# Patient Record
Sex: Male | Born: 1975 | Race: White | Hispanic: No | Marital: Married | Smoking: Former smoker
Health system: Southern US, Community
[De-identification: ages and names within clinical notes are randomized; demographics above are authoritative.]

---

## 2018-06-03 ENCOUNTER — Encounter (HOSPITAL_COMMUNITY): Payer: Self-pay | Admitting: Emergency Medicine

## 2018-06-03 ENCOUNTER — Ambulatory Visit (HOSPITAL_COMMUNITY)
Admission: EM | Admit: 2018-06-03 | Discharge: 2018-06-03 | Disposition: A | Discharge status: Home / Self Care | Payer: PRIVATE HEALTH INSURANCE | Attending: Internal Medicine | Admitting: Internal Medicine

## 2018-06-03 DIAGNOSIS — L723 Sebaceous cyst: Secondary | ICD-10-CM

## 2018-06-03 DIAGNOSIS — S00432A Contusion of left ear, initial encounter: Secondary | ICD-10-CM | POA: Diagnosis not present

## 2018-06-03 MED ORDER — PREDNISONE 50 MG PO TABS: 50.0000 mg | ORAL_TABLET | Freq: Once | ORAL | 0 refills | Status: AC

## 2018-06-03 MED ORDER — SULFAMETHOXAZOLE-TRIMETHOPRIM 800-160 MG PO TABS: 1.0000 | ORAL_TABLET | Freq: Two times a day (BID) | ORAL | 0 refills | Status: AC

## 2018-06-03 NOTE — ED Triage Notes (Signed)
Pt states he was hit by a baseball on his L ear, states last night his ear started swelling.

## 2018-06-03 NOTE — ED Provider Notes (Signed)
[facility]    CSN: [id number] Arrival date & time: 06/03/18  1242     History   Chief Complaint Chief Complaint  Patient presents with  . Otalgia    HPI John Parsons is a 42 y.o. male.   He was struck by a soccer ball on the left ear a few days ago.  Last evening, there was a little bit of swelling in the ear, and this morning he had some discomfort and increased swelling.  Swelling is in the lobe of the ear, where there is a chronic cyst.  He had a similar injury to the left ear several years ago, with swelling in the lobe, and this was managed by lancing the cyst.  No dizziness, no nausea, no difficulty thinking or concentrating.  No vision change.  Was able to walk into the urgent care independently.  Describes a slight headache last night, now resolved.    HPI  History reviewed. No pertinent past medical history.   History reviewed. No pertinent surgical history.     Home Medications    Prior to Admission medications   Medication Sig Start Date End Date Taking? Authorizing Provider  predniSONE (DELTASONE) 50 MG tablet Take 1 tablet (50 mg total) by mouth once for 1 dose. 06/03/18 06/03/18  Isa Rankin, MD  sulfamethoxazole-trimethoprim (BACTRIM DS,SEPTRA DS) 800-160 MG tablet Take 1 tablet by mouth 2 (two) times daily for 7 days. 06/03/18 06/10/18  Isa Rankin, MD    Family History No family history on file.  Social History Social History   Tobacco Use  . Smoking status: Not on file  Substance Use Topics  . Alcohol use: Not on file  . Drug use: Not on file     Allergies   Patient has no known allergies.   Review of Systems Review of Systems  All other systems reviewed and are negative.    Physical Exam Triage Vital Signs ED Triage Vitals [06/03/18 1302]  Enc Vitals Group     BP (!) 141/98     Pulse Rate 62     Resp 18     Temp 98.1 F (36.7 C)     Temp src      SpO2 98 %     Weight      Height      Pain Score       Pain Loc    Updated Vital Signs BP (!) 141/98   Pulse 62   Temp 98.1 F (36.7 C)   Resp 18   SpO2 98%  Physical Exam  Constitutional: He is oriented to person, place, and time. No distress.  Alert, nicely groomed  HENT:  Head: Atraumatic.  Left ear is diffusely slightly swollen, compared to the right, and the earlobe is moderately swollen.  There is a cystic structure palpable in the earlobe, 1.5 cm, not fluctuant, not pointing, no drainage.  Mildly tender.  Some bruising distal to the cyst.  Swelling of the ear and the earlobe is not tense. No hemotympanum.  No retroauricular bruising  Eyes:  Conjugate gaze, no eye redness/drainage  Neck: Neck supple.  Cardiovascular: Normal rate.  Pulmonary/Chest: No respiratory distress.  Lungs clear, symmetric breath sounds  Abdominal: He exhibits no distension.  Musculoskeletal: Normal range of motion.  Neurological: He is alert and oriented to person, place, and time.  Skin: Skin is warm and dry.  No cyanosis  Nursing note and vitals reviewed.    UC Treatments / Results  Labs (all labs ordered are listed, but only abnormal results are displayed) Labs Reviewed - No data to display  EKG None  Radiology No results found.  Procedures Procedures (including critical care time)  Medications Ordered in UC Medications - No data to display  Final Clinical Impressions(s) / UC Diagnoses   Final diagnoses:  Contusion of left ear, initial encounter  Sebaceous cyst of ear     Discharge Instructions     Ice to the left ear for 5 to 10 minutes several times daily should help decrease swelling.  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  Anticipate gradual improvement in swelling of the left earlobe over the next 2 to 3 days.  May take a couple weeks for ear swelling  to return to normal.  Go to the ER if there is a marked increase in swelling/pain this weekend.  Recheck or follow-up with ENT if not improving as expected.   ED Prescriptions    Medication Sig Dispense Auth. Provider   predniSONE (DELTASONE) 50 MG tablet Take 1 tablet (50 mg total) by mouth once for 1 dose. 1 tablet Isa RankinMurray, Maleea Camilo Wilson, MD   sulfamethoxazole-trimethoprim (BACTRIM DS,SEPTRA DS) 800-160 MG tablet Take 1 tablet by mouth 2 (two) times daily for 7 days. 14 tablet Isa RankinMurray, Rafe Mackowski Wilson, MD        Isa RankinMurray, Emeli Goguen Wilson, MD 06/05/18 [id number]

## 2018-06-03 NOTE — Discharge Instructions (Addendum)
Ice to the left ear for 5 to 10 minutes several times daily should help decrease swelling.  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  Anticipate gradual improvement in swelling of the left earlobe over the next 2 to 3 days.  May take a couple weeks for ear swelling to return to normal.  Go to the ER if there is a marked increase in swelling/pain this weekend.  Recheck or follow-up with ENT if not improving as expected.

## 2018-08-19 ENCOUNTER — Ambulatory Visit: Payer: Self-pay | Admitting: Physician Assistant

## 2018-08-19 ENCOUNTER — Encounter: Payer: PRIVATE HEALTH INSURANCE | Admitting: Family

## 2018-09-08 ENCOUNTER — Encounter: Payer: Self-pay | Admitting: Physician Assistant

## 2019-06-30 ENCOUNTER — Other Ambulatory Visit (INDEPENDENT_AMBULATORY_CARE_PROVIDER_SITE_OTHER): Payer: No Typology Code available for payment source

## 2019-06-30 ENCOUNTER — Ambulatory Visit (INDEPENDENT_AMBULATORY_CARE_PROVIDER_SITE_OTHER): Payer: No Typology Code available for payment source | Admitting: Family

## 2019-06-30 ENCOUNTER — Other Ambulatory Visit: Payer: Self-pay

## 2019-06-30 ENCOUNTER — Encounter: Payer: Self-pay | Admitting: Family

## 2019-06-30 VITALS — BP 128/76 | HR 60 | Temp 98.4°F | Ht 68.0 in | Wt 208.6 lb

## 2019-06-30 DIAGNOSIS — Z13220 Encounter for screening for lipoid disorders: Secondary | ICD-10-CM

## 2019-06-30 DIAGNOSIS — Z0000 Encounter for general adult medical examination without abnormal findings: Secondary | ICD-10-CM | POA: Diagnosis not present

## 2019-06-30 DIAGNOSIS — R7989 Other specified abnormal findings of blood chemistry: Secondary | ICD-10-CM | POA: Diagnosis not present

## 2019-06-30 DIAGNOSIS — Z23 Encounter for immunization: Secondary | ICD-10-CM | POA: Diagnosis not present

## 2019-06-30 DIAGNOSIS — Z125 Encounter for screening for malignant neoplasm of prostate: Secondary | ICD-10-CM

## 2019-06-30 LAB — CBC WITH DIFFERENTIAL/PLATELET
Basophils Absolute: 0.1 10*3/uL (ref 0.0–0.1)
Basophils Relative: 1.4 % (ref 0.0–3.0)
Eosinophils Absolute: 0.1 10*3/uL (ref 0.0–0.7)
Eosinophils Relative: 1.6 % (ref 0.0–5.0)
HCT: 50.7 % (ref 39.0–52.0)
Hemoglobin: 17.4 g/dL — ABNORMAL HIGH (ref 13.0–17.0)
Lymphocytes Relative: 37.2 % (ref 12.0–46.0)
Lymphs Abs: 2.7 10*3/uL (ref 0.7–4.0)
MCHC: 34.2 g/dL (ref 30.0–36.0)
MCV: 91.2 fl (ref 78.0–100.0)
Monocytes Absolute: 0.6 10*3/uL (ref 0.1–1.0)
Monocytes Relative: 8.7 % (ref 3.0–12.0)
Neutro Abs: 3.7 10*3/uL (ref 1.4–7.7)
Neutrophils Relative %: 51.1 % (ref 43.0–77.0)
Platelets: 227 10*3/uL (ref 150.0–400.0)
RBC: 5.56 Mil/uL (ref 4.22–5.81)
RDW: 12.5 % (ref 11.5–15.5)
WBC: 7.2 10*3/uL (ref 4.0–10.5)

## 2019-06-30 LAB — COMPREHENSIVE METABOLIC PANEL
ALT: 26 U/L (ref 0–53)
AST: 17 U/L (ref 0–37)
Albumin: 4.9 g/dL (ref 3.5–5.2)
Alkaline Phosphatase: 57 U/L (ref 39–117)
BUN: 14 mg/dL (ref 6–23)
CO2: 28 mEq/L (ref 19–32)
Calcium: 9.5 mg/dL (ref 8.4–10.5)
Chloride: 104 mEq/L (ref 96–112)
Creatinine, Ser: 1.19 mg/dL (ref 0.40–1.50)
GFR: 66.66 mL/min (ref >60.00)
Glucose, Bld: 85 mg/dL (ref 70–99)
Potassium: 4.1 mEq/L (ref 3.5–5.1)
Sodium: 141 mEq/L (ref 135–145)
Total Bilirubin: 0.8 mg/dL (ref 0.2–1.2)
Total Protein: 6.8 g/dL (ref 6.0–8.3)

## 2019-06-30 LAB — LIPID PANEL
Cholesterol: 195 mg/dL (ref 0–200)
HDL: 25.1 mg/dL — ABNORMAL LOW (ref >39.00)
NonHDL: 169.45
Total CHOL/HDL Ratio: 8
Triglycerides: 238 mg/dL — ABNORMAL HIGH (ref 0.0–149.0)
VLDL: 47.6 mg/dL — ABNORMAL HIGH (ref 0.0–40.0)

## 2019-06-30 LAB — PSA: PSA: 0.53 ng/mL (ref 0.10–4.00)

## 2019-06-30 LAB — VITAMIN D 25 HYDROXY (VIT D DEFICIENCY, FRACTURES): VITD: 24.24 ng/mL — ABNORMAL LOW (ref 30.00–100.00)

## 2019-06-30 LAB — LDL CHOLESTEROL, DIRECT: Direct LDL: 125 mg/dL

## 2019-06-30 NOTE — Progress Notes (Signed)
John Parsons is a 43 y.o. male with the following history as recorded in EpicCare:  There are no active problems to display for this patient.   No current outpatient medications on file.   No current facility-administered medications for this visit.     Allergies: Patient has no known allergies.  History reviewed. No pertinent past medical history.  History reviewed. No pertinent surgical history.  History reviewed. No pertinent family history.  Social History   Tobacco Use  . Smoking status: Former Smoker    Types: Cigarettes    Quit date: 05/28/2014    Years since quitting: 5.0  . Smokeless tobacco: Never Used  Substance Use Topics  . Alcohol use: Yes    Comment: social    Subjective:  Patient presents as a new patient today; in baseline state of health; Will need to follow-up with urology- history of need for Clomid; Up to date on eye doctor; does not see dentist regularly; Sleeping 6-8 hours/ night;   Review of Systems  Constitutional: Negative.   HENT: Negative.   Eyes: Negative.   Respiratory: Negative.   Cardiovascular: Negative.   Gastrointestinal: Negative.   Genitourinary: Negative.   Musculoskeletal: Negative.   Skin: Negative.   Neurological: Negative.   Endo/Heme/Allergies: Negative.   Psychiatric/Behavioral: Negative.       Objective:  Vitals:   06/30/19 1108  BP: 128/76  Pulse: 60  Temp: 98.4 F (36.9 C)  TempSrc: Oral  SpO2: 96%  Weight: 208 lb 9.6 oz (94.6 kg)  Height: _0  (1.727 m)    General: Well developed, well nourished, in no acute distress  Skin : Warm and dry.  Head: Normocephalic and atraumatic  Eyes: Sclera and conjunctiva clear; pupils round and reactive to light; extraocular movements intact  Ears: External normal; canals clear; tympanic membranes normal  Oropharynx: Pink, supple. No suspicious lesions  Neck: Supple without thyromegaly, adenopathy  Lungs: Respirations unlabored; clear to auscultation bilaterally  without wheeze, rales, rhonchi  CVS exam: normal rate and regular rhythm.  Abdomen: Soft; nontender; nondistended; normoactive bowel sounds; no masses or hepatosplenomegaly  Musculoskeletal: No deformities; no active joint inflammation  Extremities: No edema, cyanosis, clubbing  Vessels: Symmetric bilaterally  Neurologic: Alert and oriented; speech intact; face symmetrical; moves all extremities well; CNII-XII intact without focal deficit   Assessment:  1. PE (physical exam), annual   2. 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   No follow-ups on file.  Orders Placed This Encounter  Procedures  . Tdap vaccine greater than or equal to 7yo IM  . CBC w/Diff    Standing Status:   Future    Number of Occurrences:   1    Standing Expiration Date:   06/29/2020  . Comp Met (CMET)    Standing Status:   Future    Number of Occurrences:   1    Standing Expiration Date:   06/29/2020  . Lipid panel    Standing Status:   Future    Number of Occurrences:   1    Standing Expiration Date:   06/29/2020  . Vitamin D (25  hydroxy)    Standing Status:   Future    Number of Occurrences:   1    Standing Expiration Date:   06/29/2020  . PSA    Standing Status:   Future    Number of Occurrences:   1    Standing Expiration Date:   06/29/2020    Requested Prescriptions    No prescriptions requested or ordered in this encounter

## 2019-06-30 NOTE — Patient Instructions (Signed)

## 2019-09-15 ENCOUNTER — Encounter: Payer: Self-pay | Admitting: Family

## 2019-09-15 ENCOUNTER — Ambulatory Visit (INDEPENDENT_AMBULATORY_CARE_PROVIDER_SITE_OTHER)
Admission: RE | Admit: 2019-09-15 | Discharge: 2019-09-15 | Disposition: A | Discharge status: Home / Self Care | Payer: No Typology Code available for payment source | Source: Ambulatory Visit | Attending: Family | Admitting: Family

## 2019-09-15 ENCOUNTER — Other Ambulatory Visit (INDEPENDENT_AMBULATORY_CARE_PROVIDER_SITE_OTHER): Payer: No Typology Code available for payment source

## 2019-09-15 ENCOUNTER — Other Ambulatory Visit: Payer: Self-pay

## 2019-09-15 ENCOUNTER — Ambulatory Visit (INDEPENDENT_AMBULATORY_CARE_PROVIDER_SITE_OTHER): Payer: No Typology Code available for payment source | Admitting: Family

## 2019-09-15 VITALS — BP 124/88 | HR 80 | Temp 98.7°F | Wt 215.0 lb

## 2019-09-15 DIAGNOSIS — M79671 Pain in right foot: Secondary | ICD-10-CM | POA: Diagnosis not present

## 2019-09-15 DIAGNOSIS — E790 Hyperuricemia without signs of inflammatory arthritis and tophaceous disease: Secondary | ICD-10-CM

## 2019-09-15 DIAGNOSIS — M109 Gout, unspecified: Secondary | ICD-10-CM | POA: Insufficient documentation

## 2019-09-15 LAB — COMPREHENSIVE METABOLIC PANEL
ALT: 18 U/L (ref 0–53)
AST: 15 U/L (ref 0–37)
Albumin: 4.4 g/dL (ref 3.5–5.2)
Alkaline Phosphatase: 54 U/L (ref 39–117)
BUN: 18 mg/dL (ref 6–23)
CO2: 30 mEq/L (ref 19–32)
Calcium: 9.4 mg/dL (ref 8.4–10.5)
Chloride: 102 mEq/L (ref 96–112)
Creatinine, Ser: 1.33 mg/dL (ref 0.40–1.50)
GFR: 58.58 mL/min — ABNORMAL LOW (ref >60.00)
Glucose, Bld: 84 mg/dL (ref 70–99)
Potassium: 4.3 mEq/L (ref 3.5–5.1)
Sodium: 139 mEq/L (ref 135–145)
Total Bilirubin: 0.4 mg/dL (ref 0.2–1.2)
Total Protein: 6.6 g/dL (ref 6.0–8.3)

## 2019-09-15 LAB — CBC WITH DIFFERENTIAL/PLATELET
Basophils Absolute: 0.1 10*3/uL (ref 0.0–0.1)
Basophils Relative: 0.4 % (ref 0.0–3.0)
Eosinophils Absolute: 0.3 10*3/uL (ref 0.0–0.7)
Eosinophils Relative: 2.7 % (ref 0.0–5.0)
HCT: 49.2 % (ref 39.0–52.0)
Hemoglobin: 17.1 g/dL — ABNORMAL HIGH (ref 13.0–17.0)
Lymphocytes Relative: 28.7 % (ref 12.0–46.0)
Lymphs Abs: 3.7 10*3/uL (ref 0.7–4.0)
MCHC: 34.9 g/dL (ref 30.0–36.0)
MCV: 91.6 fl (ref 78.0–100.0)
Monocytes Absolute: 1.2 10*3/uL — ABNORMAL HIGH (ref 0.1–1.0)
Monocytes Relative: 9.7 % (ref 3.0–12.0)
Neutro Abs: 7.4 10*3/uL (ref 1.4–7.7)
Neutrophils Relative %: 58.5 % (ref 43.0–77.0)
Platelets: 235 10*3/uL (ref 150.0–400.0)
RBC: 5.37 Mil/uL (ref 4.22–5.81)
RDW: 12.4 % (ref 11.5–15.5)
WBC: 12.7 10*3/uL — ABNORMAL HIGH (ref 4.0–10.5)

## 2019-09-15 LAB — URIC ACID: Uric Acid, Serum: 10 mg/dL — ABNORMAL HIGH (ref 4.0–7.8)

## 2019-09-15 MED ORDER — CEPHALEXIN 500 MG PO CAPS: 500.0000 mg | ORAL_CAPSULE | Freq: Two times a day (BID) | ORAL | 0 refills | Status: DC

## 2019-09-15 MED ORDER — PREDNISONE 20 MG PO TABS: 40.0000 mg | ORAL_TABLET | Freq: Every day | ORAL | 0 refills | Status: DC

## 2019-09-15 NOTE — Patient Instructions (Signed)
Gout  Gout is a condition that causes painful swelling of the joints. Gout is a type of inflammation of the joints (arthritis). This condition is caused by having too much uric acid in the body. Uric acid is a chemical that forms when the body breaks down substances called purines. Purines are important for building body proteins. When the body has too much uric acid, sharp crystals can form and build up inside the joints. This causes pain and swelling. Gout attacks can happen quickly and may be very painful (acute gout). Over time, the attacks can affect more joints and become more frequent (chronic gout). Gout can also cause uric acid to build up under the skin and inside the kidneys. What are the causes? This condition is caused by too much uric acid in your blood. This can happen because:  Your kidneys do not remove enough uric acid from your blood. This is the most common cause.  Your body makes too much uric acid. This can happen with some cancers and cancer treatments. It can also occur if your body is breaking down too many red blood cells (hemolytic anemia).  You eat too many foods that are high in purines. These foods include organ meats and some seafood. Alcohol, especially beer, is also high in purines. A gout attack may be triggered by trauma or stress. What increases the risk? You are more likely to develop this condition if you:  Have a family history of gout.  Are male and middle-aged.  Are male and have gone through menopause.  Are obese.  Frequently drink alcohol, especially beer.  Are dehydrated.  Lose weight too quickly.  Have an organ transplant.  Have lead poisoning.  Take certain medicines, including aspirin, cyclosporine, diuretics, levodopa, and niacin.  Have kidney disease.  Have a skin condition called psoriasis. What are the signs or symptoms? An attack of acute gout happens quickly. It usually occurs in just one joint. The most common place is  the big toe. Attacks often start at night. Other joints that may be affected include joints of the feet, ankle, knee, fingers, wrist, or elbow. Symptoms of this condition may include:  Severe pain.  Warmth.  Swelling.  Stiffness.  Tenderness. The affected joint may be very painful to touch.  Shiny, red, or purple skin.  Chills and fever. Chronic gout may cause symptoms more frequently. More joints may be involved. You may also have white or yellow lumps (tophi) on your hands or feet or in other areas near your joints. How is this diagnosed? This condition is diagnosed based on your symptoms, medical history, and physical exam. You may have tests, such as:  Blood tests to measure uric acid levels.  Removal of joint fluid with a thin needle (aspiration) to look for uric acid crystals.  X-rays to look for joint damage. How is this treated? Treatment for this condition has two phases: treating an acute attack and preventing future attacks. Acute gout treatment may include medicines to reduce pain and swelling, including:  NSAIDs.  Steroids. These are strong anti-inflammatory medicines that can be taken by mouth (orally) or injected into a joint.  Colchicine. This medicine relieves pain and swelling when it is taken soon after an attack. It can be given by mouth or through an IV. Preventive treatment may include:  Daily use of smaller doses of NSAIDs or colchicine.  Use of a medicine that reduces uric acid levels in your blood.  Changes to your diet. You may   need to see a dietitian about what to eat and drink to prevent gout. Follow these instructions at home: During a gout attack   If directed, put ice on the affected area: ? Put ice in a plastic bag. ? Place a towel between your skin and the bag. ? Leave the ice on for 20 minutes, 2-3 times a day.  Raise (elevate) the affected joint above the level of your heart as often as possible.  Rest the joint as much as possible.  If the affected joint is in your leg, you may be given crutches to use.  Follow instructions from your health care provider about eating or drinking restrictions. Avoiding future gout attacks  Follow a low-purine diet as told by your dietitian or health care provider. Avoid foods and drinks that are high in purines, including liver, kidney, anchovies, asparagus, herring, mushrooms, mussels, and beer.  Maintain a healthy weight or lose weight if you are overweight. If you want to lose weight, talk with your health care provider. It is important that you do not lose weight too quickly.  Start or maintain an exercise program as told by your health care provider. Eating and drinking  Drink enough fluids to keep your urine pale yellow.  If you drink alcohol: ? Limit how much you use to:  0-1 drink a day for women.  0-2 drinks a day for men. ? Be aware of how much alcohol is in your drink. In the [place] one drink equals one 12 oz bottle of beer (355 mL) one 5 oz glass of wine (148 mL), or one 1 oz glass of hard liquor (44 mL). General instructions  Take over-the-counter and prescription medicines only as told by your health care provider.  Do not drive or use heavy machinery while taking prescription pain medicine.  Return to your normal activities as told by your health care provider. Ask your health care provider what activities are safe for you.  Keep all follow-up visits as told by your health care provider. This is important. Contact a health care provider if you have:  Another gout attack.  Continuing symptoms of a gout attack after 10 days of treatment.  Side effects from your medicines.  Chills or a fever.  Burning pain when you urinate.  Pain in your lower back or belly. Get help right away if you:  Have severe or uncontrolled pain.  Cannot urinate. Summary  Gout is painful swelling of the joints caused by inflammation.  The most common site of pain is the big  toe, but it can affect other joints in the body.  Medicines and dietary changes can help to prevent and treat gout attacks. This information is not intended to replace advice given to you by your health care provider. Make sure you discuss any questions you have with your health care provider. Document Released: 11/27/2000 Document Revised: 06/22/2018 Document Reviewed: 06/22/2018 Elsevier Patient Education  2020 [employer]  

## 2019-09-15 NOTE — Progress Notes (Signed)
John Parsons is a 43 y.o. male with the following history as recorded in EpicCare:  Patient Active Problem List   Diagnosis Date Noted  . Gout 09/15/2019    Current Outpatient Medications  Medication Sig Dispense Refill  . clomiPHENE (CLOMID) 50 MG tablet Take by mouth daily.    . cephALEXin (KEFLEX) 500 MG capsule Take 1 capsule (500 mg total) by mouth 2 (two) times daily. 10 capsule 0  . predniSONE (DELTASONE) 20 MG tablet Take 2 tablets (40 mg total) by mouth daily with breakfast. 10 tablet 0   No current facility-administered medications for this visit.     Allergies: Patient has no known allergies.  No past medical history on file.  No past surgical history on file.  No family history on file.  Social History   Tobacco Use  . Smoking status: Former Smoker    Types: Cigarettes    Quit date: 05/28/2014    Years since quitting: 5.3  . Smokeless tobacco: Never Used  Substance Use Topics  . Alcohol use: Yes    Comment: social    Subjective:  Patient presents with concerns for 1 week history of right foot pain/ swelling; no known injury or trauma; per patient, does have history of gout- never formally diagnosed. Was recently at the beach and did eat more seafood; has taken some Ibuprofen with some benefit; No pain or swelling in the right lower calf;      Objective:  Vitals:   09/15/19 1551  BP: 124/88  Pulse: 80  Temp: 98.7 F (37.1 C)  SpO2: 97%  Weight: 215 lb (97.5 kg)    General: Well developed, well nourished, in no acute distress  Skin : Warm and dry.  Head: Normocephalic and atraumatic  Lungs: Respirations unlabored;  Musculoskeletal: No deformities; marked swelling noted in right foot; Extremities: Not pitting edema in right lower extremity Vessels: Symmetric bilaterally  Neurologic: Alert and oriented; speech intact; face symmetrical; moves all extremities well; CNII-XII intact without focal deficit   Assessment:  1. Right foot pain   2. Elevated  blood uric acid level     Plan:  C/w gout; STAT X-ray does not show fracture; Rx for Prednisone and Keflex; also encouraged to drink cherry juice;  Plan to get uric acid and CBC re-checked in 1 month.   No follow-ups on file.  Orders Placed This Encounter  Procedures  . DG Foot Complete Right    Standing Status:   Future    Number of Occurrences:   1    Standing Expiration Date:   11/14/2020    Order Specific Question:   Reason for Exam (SYMPTOM  OR DIAGNOSIS REQUIRED)    Answer:   foot pain/ swelling    Order Specific Question:   Preferred imaging location?    Answer:   [facility]    Order Specific Question:   Radiology Contrast Protocol - do NOT remove file path    Answer:   [email or web address]  . CBC w/Diff    Standing Status:   Future    Number of Occurrences:   1    Standing Expiration Date:   09/14/2020  . Comp Met (CMET)    Standing Status:   Future    Number of Occurrences:   1    Standing Expiration Date:   09/14/2020  . Uric acid    Standing Status:   Future    Number of Occurrences:   1    Standing Expiration Date:  09/14/2020  . CBC w/Diff    Standing Status:   Future    Standing Expiration Date:   09/14/2020  . Uric acid    Standing Status:   Future    Standing Expiration Date:   09/14/2020    Requested Prescriptions   Signed Prescriptions Disp Refills  . predniSONE (DELTASONE) 20 MG tablet 10 tablet 0    Sig: Take 2 tablets (40 mg total) by mouth daily with breakfast.  . cephALEXin (KEFLEX) 500 MG capsule 10 capsule 0    Sig: Take 1 capsule (500 mg total) by mouth 2 (two) times daily.

## 2019-10-27 ENCOUNTER — Other Ambulatory Visit: Payer: Self-pay

## 2019-10-27 DIAGNOSIS — Z20822 Contact with and (suspected) exposure to covid-19: Secondary | ICD-10-CM

## 2019-10-30 LAB — NOVEL CORONAVIRUS, NAA: SARS-CoV-2, NAA: NOT DETECTED

## 2020-08-21 ENCOUNTER — Encounter: Payer: Self-pay | Admitting: Family

## 2020-08-21 ENCOUNTER — Other Ambulatory Visit: Payer: Self-pay | Admitting: Family

## 2020-08-21 MED ORDER — INDOMETHACIN 50 MG PO CAPS: 50.0000 mg | ORAL_CAPSULE | Freq: Three times a day (TID) | ORAL | 0 refills | Status: AC | PRN

## 2020-08-28 ENCOUNTER — Encounter: Payer: Self-pay | Admitting: Family

## 2020-09-03 ENCOUNTER — Ambulatory Visit (HOSPITAL_COMMUNITY): Payer: Self-pay

## 2020-09-06 ENCOUNTER — Telehealth (INDEPENDENT_AMBULATORY_CARE_PROVIDER_SITE_OTHER): Payer: No Typology Code available for payment source | Admitting: Family

## 2020-09-06 DIAGNOSIS — J209 Acute bronchitis, unspecified: Secondary | ICD-10-CM

## 2020-09-06 MED ORDER — AZITHROMYCIN 250 MG PO TABS: ORAL_TABLET | ORAL | 0 refills | Status: AC

## 2020-09-06 MED ORDER — HYDROCODONE-HOMATROPINE 5-1.5 MG/5ML PO SYRP: 5.0000 mL | ORAL_SOLUTION | Freq: Three times a day (TID) | ORAL | 0 refills | Status: AC | PRN

## 2020-09-06 NOTE — Progress Notes (Signed)
  John Parsons is a 44 y.o. male with the following history as recorded in EpicCare:  Patient Active Problem List   Diagnosis Date Noted  . Gout 09/15/2019    Current Outpatient Medications  Medication Sig Dispense Refill  . clomiPHENE (CLOMID) 50 MG tablet Take by mouth daily.    . indomethacin (INDOCIN) 50 MG capsule Take 1 capsule (50 mg total) by mouth 3 (three) times daily as needed (for gout flare). 30 capsule 0  . azithromycin (ZITHROMAX) 250 MG tablet 2 tabs po qd x 1 day; 1 tablet per day x 4 days; 6 tablet 0  . HYDROcodone-homatropine (HYCODAN) 5-1.5 MG/5ML syrup Take 5 mLs by mouth every 8 (eight) hours as needed for cough. 120 mL 0   No current facility-administered medications for this visit.    Allergies: Patient has no known allergies.  No past medical history on file.  No past surgical history on file.  No family history on file.  Social History   Tobacco Use  . Smoking status: Former Smoker    Types: Cigarettes    Quit date: 05/28/2014    Years since quitting: 6.2  . Smokeless tobacco: Never Used  Substance Use Topics  . Alcohol use: Yes    Comment: social    Subjective:   I connected with John Parsons on 09/06/20 at  1:40 PM EDT by a video enabled telemedicine application and verified that I am speaking with the correct person using two identifiers.   I discussed the limitations of evaluation and management by telemedicine and the availability of in person appointments. The patient expressed understanding and agreed to proceed. Provider in office/ patient is at home; provider and patient are only 2 people on video call.   Patient called last week with concerns for sinus infection; now feels like symptoms have moved into his chest; persisting cough/ congestion; cough is keeping him awake at night; + productive cough; + COVID vaccinated; negative COVID test last week;     Objective:  There were no vitals filed for this visit.  General: Well developed, well  nourished, in no acute distress  Head: Normocephalic and atraumatic  Lungs: Respirations unlabored;  Neurologic: Alert and oriented; speech intact; face symmetrical; moves all extremities well; CNII-XII intact without focal deficit   Assessment:  1. Acute bronchitis, unspecified organism     Plan:  Rx for Zithromax, Hycodan cough syrup; increase fluids, rest and follow-up worse, no better; may need to consider CXR if symptoms persist;    No follow-ups on file.  No orders of the defined types were placed in this encounter.   Requested Prescriptions   Signed Prescriptions Disp Refills  . azithromycin (ZITHROMAX) 250 MG tablet 6 tablet 0    Sig: 2 tabs po qd x 1 day; 1 tablet per day x 4 days;  . HYDROcodone-homatropine (HYCODAN) 5-1.5 MG/5ML syrup 120 mL 0    Sig: Take 5 mLs by mouth every 8 (eight) hours as needed for cough.

## 2021-03-01 IMAGING — DX DG FOOT COMPLETE 3+V*R*
3 series · 3 of 3 positions shown · non-contrast
Comparison: None.

CLINICAL DATA: Right foot pain and swelling. No known injury.

EXAM:
RIGHT FOOT COMPLETE - 3+ VIEW

[foot ap]
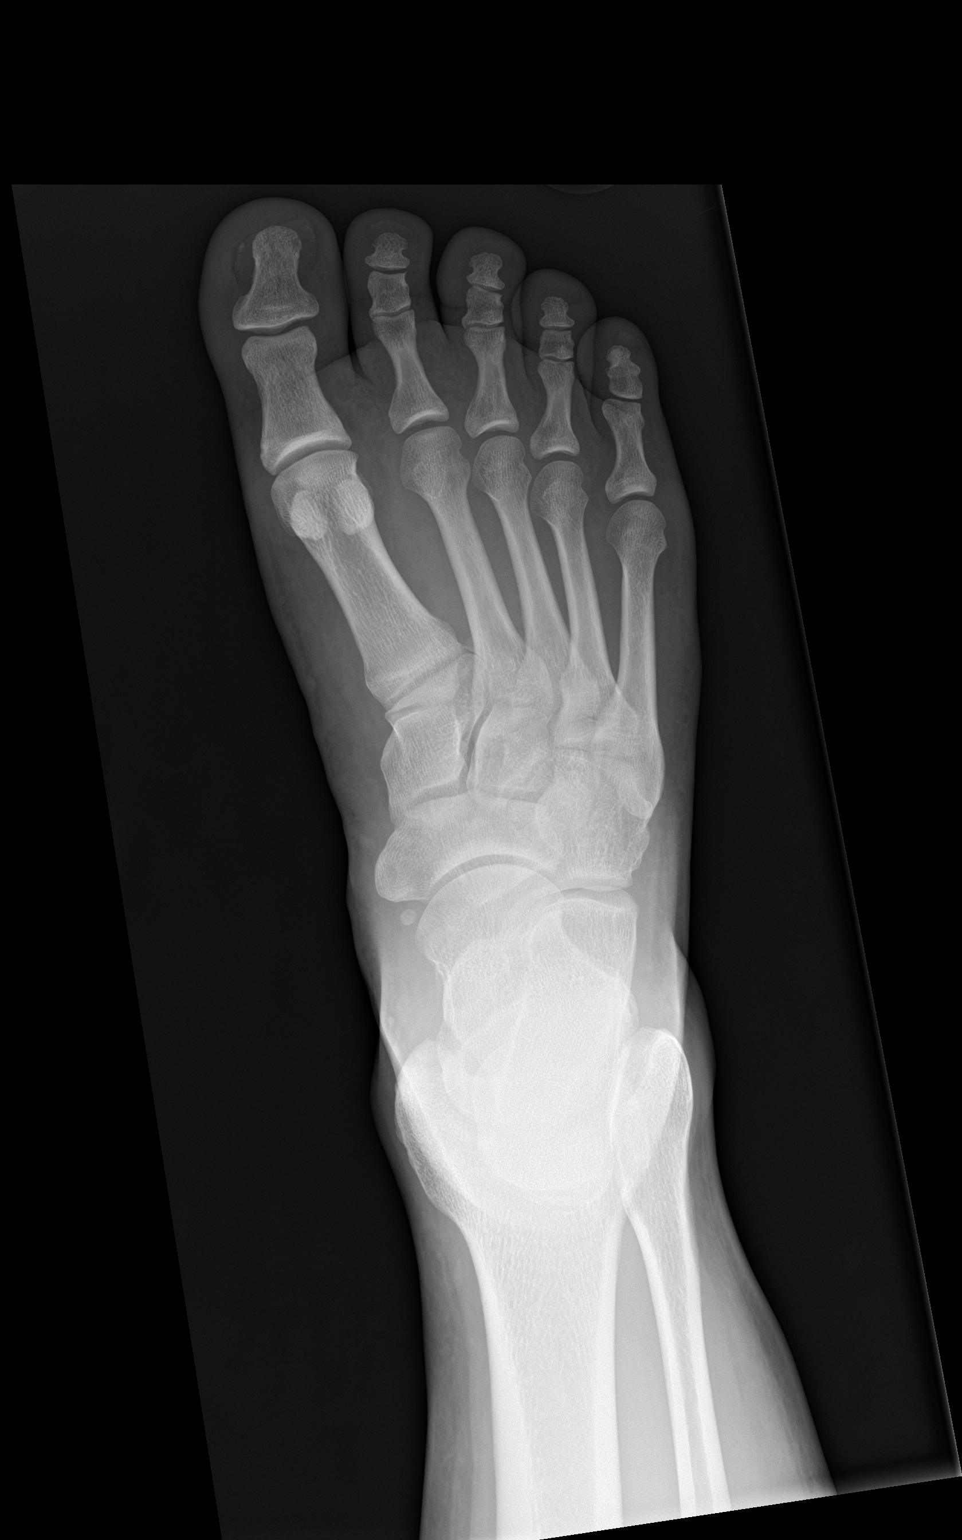

[foot obl]
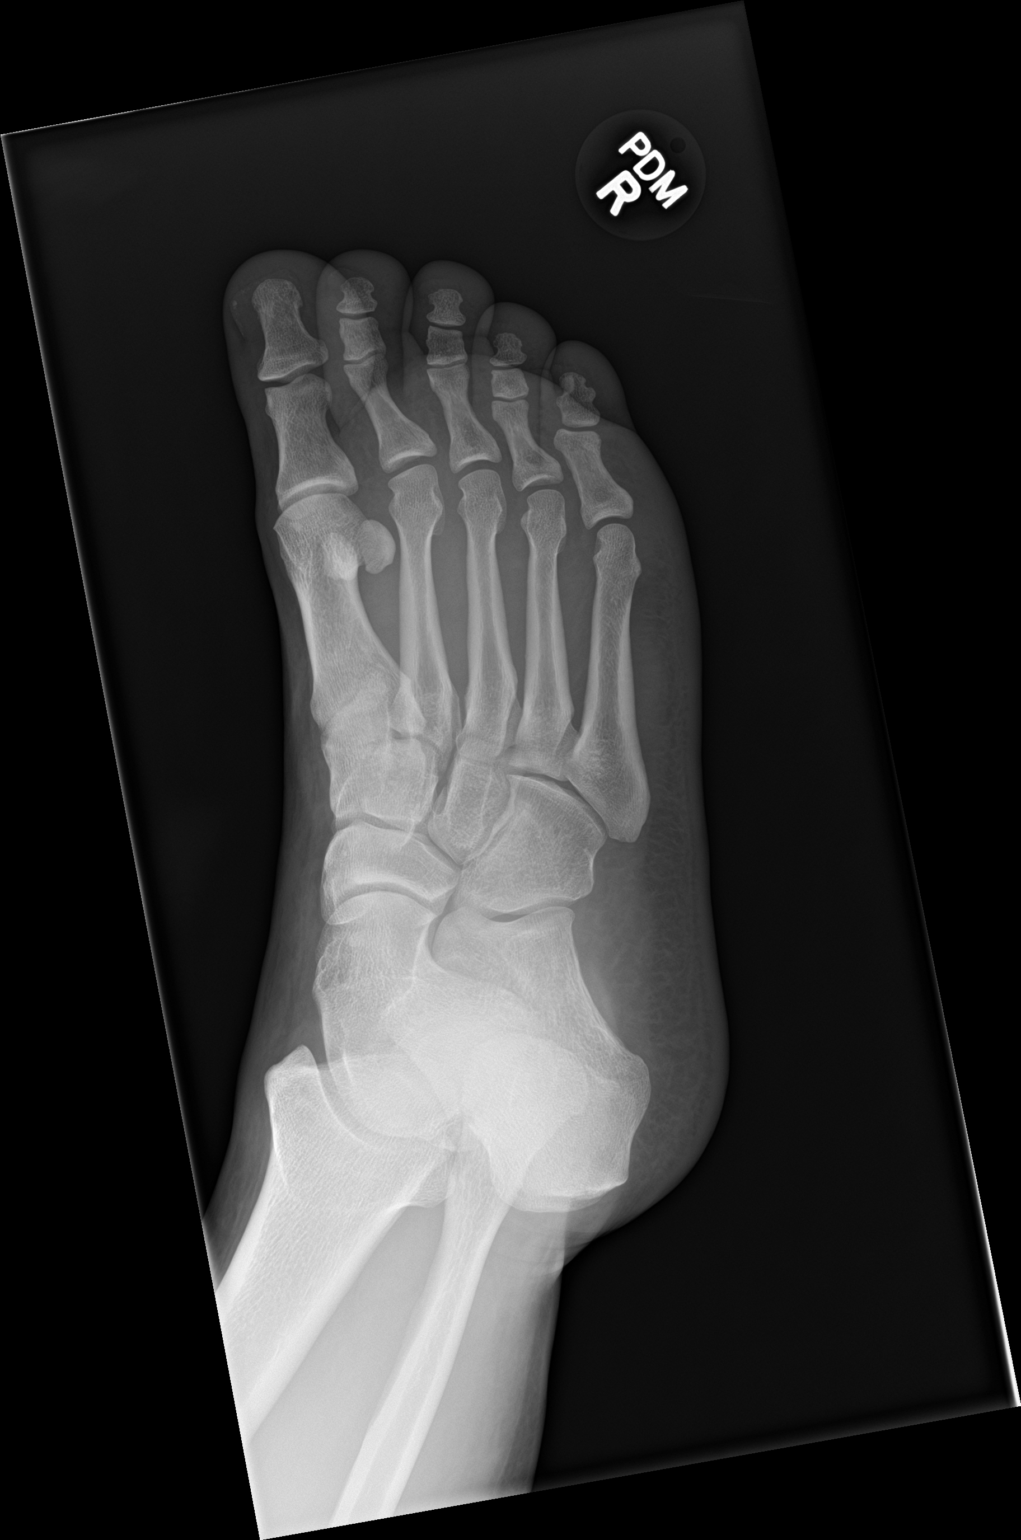

[foot lat]
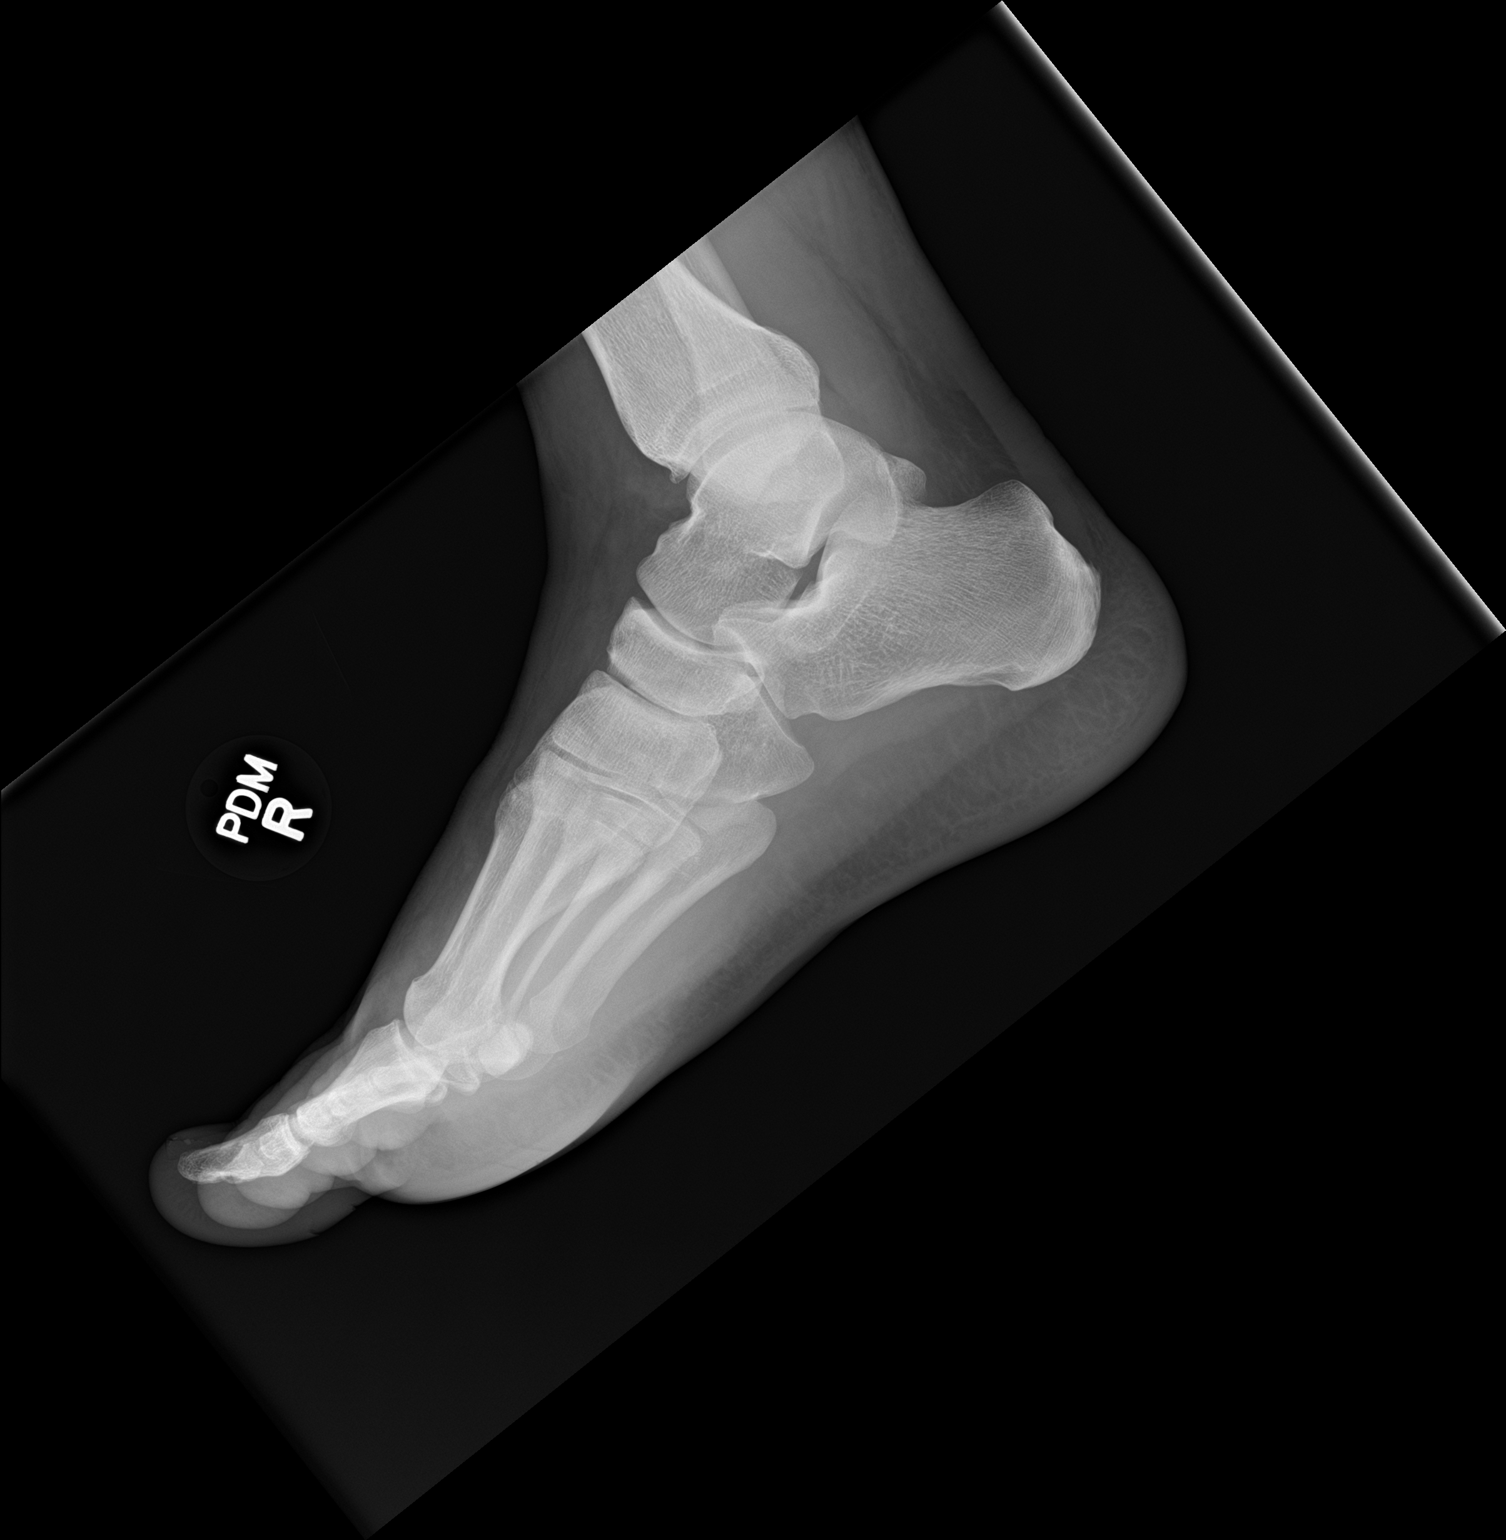

[3 of 3 positions shown; findings below may reference images not displayed]

FINDINGS: Mild talotibial spur formation. Minimal posterior calcaneal
enthesophyte formation. Otherwise, unremarkable bones and soft
tissues. No fracture or dislocation.
IMPRESSION: Mild talotibial degenerative changes.  No acute abnormality.

## 2022-10-14 DEATH — deceased
